# Patient Record
Sex: Male | Born: 1985 | Hispanic: No | Marital: Single
Health system: Southern US, Community
[De-identification: ages and names within clinical notes are randomized; demographics above are authoritative.]

---
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
## 2008-10-03 ENCOUNTER — Encounter: Admission: RE | Admit: 2008-10-03 | Discharge: 2008-10-03 | Payer: Self-pay | Admitting: Gastroenterology

## 2018-05-20 ENCOUNTER — Other Ambulatory Visit: Payer: Self-pay

## 2018-05-20 ENCOUNTER — Emergency Department (HOSPITAL_COMMUNITY): Payer: 59

## 2018-05-20 ENCOUNTER — Encounter (HOSPITAL_COMMUNITY): Payer: Self-pay

## 2018-05-20 ENCOUNTER — Emergency Department (HOSPITAL_COMMUNITY)
Admission: EM | Admit: 2018-05-20 | Discharge: 2018-05-20 | Disposition: A | Discharge status: Home / Self Care | Payer: 59 | Attending: Emergency Medicine | Admitting: Emergency Medicine

## 2018-05-20 DIAGNOSIS — R109 Unspecified abdominal pain: Secondary | ICD-10-CM

## 2018-05-20 DIAGNOSIS — R1012 Left upper quadrant pain: Secondary | ICD-10-CM

## 2018-05-20 DIAGNOSIS — R110 Nausea: Secondary | ICD-10-CM

## 2018-05-20 LAB — URINALYSIS, ROUTINE W REFLEX MICROSCOPIC
Bilirubin Urine: NEGATIVE
Glucose, UA: NEGATIVE mg/dL
Hgb urine dipstick: NEGATIVE
Ketones, ur: NEGATIVE mg/dL
Leukocytes, UA: NEGATIVE
Nitrite: NEGATIVE
Protein, ur: NEGATIVE mg/dL
Specific Gravity, Urine: 1.021 (ref 1.005–1.030)
pH: 7 (ref 5.0–8.0)

## 2018-05-20 LAB — BASIC METABOLIC PANEL
Anion gap: 9 (ref 5–15)
BUN: 17 mg/dL (ref 6–20)
CO2: 28 mmol/L (ref 22–32)
Calcium: 9.3 mg/dL (ref 8.9–10.3)
Chloride: 104 mmol/L (ref 101–111)
Creatinine, Ser: 1.01 mg/dL (ref 0.61–1.24)
GFR calc Af Amer: >60 mL/min (ref >60)
GFR calc non Af Amer: >60 mL/min (ref >60)
Glucose, Bld: 84 mg/dL (ref 65–99)
Potassium: 4.2 mmol/L (ref 3.5–5.1)
Sodium: 141 mmol/L (ref 135–145)

## 2018-05-20 LAB — CBC
HCT: 44.2 % (ref 39.0–52.0)
Hemoglobin: 14.9 g/dL (ref 13.0–17.0)
MCH: 27.6 pg (ref 26.0–34.0)
MCHC: 33.7 g/dL (ref 30.0–36.0)
MCV: 81.9 fL (ref 78.0–100.0)
Platelets: 345 10*3/uL (ref 150–400)
RBC: 5.4 MIL/uL (ref 4.22–5.81)
RDW: 13.6 % (ref 11.5–15.5)
WBC: 6.3 10*3/uL (ref 4.0–10.5)

## 2018-05-20 LAB — HEPATIC FUNCTION PANEL
ALBUMIN: 4.1 g/dL (ref 3.5–5.0)
ALT: 35 U/L (ref 17–63)
AST: 23 U/L (ref 15–41)
Alkaline Phosphatase: 72 U/L (ref 38–126)
Bilirubin, Direct: 0.1 mg/dL (ref 0.1–0.5)
Indirect Bilirubin: 0.6 mg/dL (ref 0.3–0.9)
TOTAL PROTEIN: 8.1 g/dL (ref 6.5–8.1)
Total Bilirubin: 0.7 mg/dL (ref 0.3–1.2)

## 2018-05-20 LAB — LIPASE, BLOOD: Lipase: 29 U/L (ref 11–51)

## 2018-05-20 MED ORDER — SUCRALFATE 1 GM/10ML PO SUSP: 1.0000 g | Freq: Three times a day (TID) | ORAL | 0 refills | Status: AC

## 2018-05-20 MED ORDER — ONDANSETRON HCL 4 MG PO TABS: 4.0000 mg | ORAL_TABLET | Freq: Three times a day (TID) | ORAL | 0 refills | Status: AC | PRN

## 2018-05-20 NOTE — ED Notes (Signed)
Patient transported to CT 

## 2018-05-20 NOTE — ED Provider Notes (Signed)
[facility]-EMERGENCY DEPT Provider Note   CSN: [id number] Arrival date & time: 05/20/18  1209     History   Chief Complaint Chief Complaint  Patient presents with  . Flank Pain    HPI Bradley Petersen is a 32 y.o. male with no significant past medical history presents emergency department today with left flank pain that radiates into his left mid abdomen last 2 to 3 days.  Patient states that he was sitting at work when he started having a sharp/knawing pain that radiated from his left flank and his left abdomen with associated nausea without emesis.  He notes he has been taking over-the-counter medication for symptoms with mild relief.  He denies history of same pain in the past.  No history of kidney stones, Gi bleed or ulcer.  Patient states that he thought this may be related to working out as he does have some back pain usually after dead lifting.  He denies any midline back pain, bowel/bladder incontinence, urinary retention, numbness/tingling/weakness of the lower extremities.  He states that his pain is worse with twisting of the abdomen.  He states that it is approved at times after eating.  Patient states that he has also had thin, dark brown stools for the last 2 days.  He denies any melena or hematochezia.  Last bowel movement this morning.  He still passing flatus.  Patient denies any fever, chills, chest pain, shortness of breath, bilious or bloody emesis, urinary frequency, urinary urgency, dysuria, hematuria, decreased urine output, foul-smelling urine.  Pain does not radiate into his groin or testicular region.  He denies any penile discharge or penile pain.  No previous abdominal surgeries. Patient does state that he has been using ibuprofen more often recently due to msk pain and a sinus infection that he has a few weeks ago.   HPI  History reviewed. No pertinent past medical history.  There are no active problems to display for this  patient.     Home Medications    Prior to Admission medications   Not on File    Family History History reviewed. No pertinent family history.  Social History Social History   Tobacco Use  . Smoking status: Not on file  Substance Use Topics  . Alcohol use: Not on file  . Drug use: Not on file     Allergies   Sulfa antibiotics   Review of Systems Review of Systems  All other systems reviewed and are negative.    Physical Exam Updated Vital Signs BP 127/88 (BP Location: Right Arm)   Pulse 72   Temp 98 F (36.7 C) (Oral)   Resp 15   SpO2 100%   Physical Exam  Constitutional: He appears well-developed and well-nourished.  Non-toxic appearing. No acute distress   HENT:  Head: Normocephalic and atraumatic.  Right Ear: External ear normal.  Left Ear: External ear normal.  Nose: Nose normal.  Mouth/Throat: Uvula is midline, oropharynx is clear and moist and mucous membranes are normal. No tonsillar exudate.  Eyes: Pupils are equal, round, and reactive to light. Right eye exhibits no discharge. Left eye exhibits no discharge. No scleral icterus.  Neck: Trachea normal. Neck supple. No spinous process tenderness present. No neck rigidity. Normal range of motion present.  Cardiovascular: Normal rate, regular rhythm and intact distal pulses.  No murmur heard. Pulses:      Radial pulses are 2+ on the right side, and 2+ on the left side.  Dorsalis pedis pulses are 2+ on the right side, and 2+ on the left side.       Posterior tibial pulses are 2+ on the right side, and 2+ on the left side.  No lower extremity swelling or edema. Calves symmetric in size bilaterally.  Pulmonary/Chest: Effort normal and breath sounds normal. He exhibits no tenderness.  Abdominal: Soft. Normal appearance and bowel sounds are normal. He exhibits no distension and no pulsatile midline mass. There is no tenderness. There is no rigidity, no rebound, no guarding and no CVA tenderness.   Musculoskeletal: He exhibits no edema.  No cervical, thoracic or lumbar spinous tenderness palpation.  No paraspinal tenderness palpation.  Bilateral lower extremity strength 5/5 and equal.  Normal sensation to the lower extremities bilaterally.  Dorsalis pedis and posterior tib pulses 2+ bilaterally.  Compartments soft of the lower extremities.  Lymphadenopathy:    He has no cervical adenopathy.  Neurological: He is alert. He has normal strength. No sensory deficit.  Moves all 4 extremities without difficulty.  Skin: Skin is warm and dry. Capillary refill takes less than 2 seconds. No rash noted. He is not diaphoretic.  No vesicular-like rash on the left flank or left abdomen  Psychiatric: He has a normal mood and affect.  Nursing note and vitals reviewed.    ED Treatments / Results  Labs (all labs ordered are listed, but only abnormal results are displayed) Labs Reviewed  URINALYSIS, ROUTINE W REFLEX MICROSCOPIC  CBC  BASIC METABOLIC PANEL  HEPATIC FUNCTION PANEL  LIPASE, BLOOD    EKG None  Radiology Ct Renal Stone Study  Result Date: 05/20/2018 CLINICAL DATA:  Acute left flank pain. EXAM: CT ABDOMEN AND PELVIS WITHOUT CONTRAST TECHNIQUE: Multidetector CT imaging of the abdomen and pelvis was performed following the standard protocol without IV contrast. COMPARISON:  CT scan of October 03, 2008. FINDINGS: Lower chest: No acute abnormality. Hepatobiliary: No focal liver abnormality is seen. No gallstones, gallbladder wall thickening, or biliary dilatation. Pancreas: Unremarkable. No pancreatic ductal dilatation or surrounding inflammatory changes. Spleen: Normal in size without focal abnormality. Adrenals/Urinary Tract: Adrenal glands are unremarkable. Kidneys are normal, without renal calculi, focal lesion, or hydronephrosis. Bladder is unremarkable. Stomach/Bowel: Stomach is within normal limits. Appendix appears normal. No evidence of bowel wall thickening, distention, or  inflammatory changes. Vascular/Lymphatic: No significant vascular findings are present. No enlarged abdominal or pelvic lymph nodes. Reproductive: Prostate is unremarkable. Other: No abdominal wall hernia or abnormality. No abdominopelvic ascites. Musculoskeletal: No acute or significant osseous findings. IMPRESSION: No hydronephrosis or renal obstruction is noted. No renal or ureteral calculi are noted. No significant abnormality seen in the abdomen or pelvis. Electronically Signed   By: Lupita Raider, M.D.   On: 05/20/2018 14:38    Procedures Procedures (including critical care time)  Medications Ordered in ED Medications - No data to display   Initial Impression / Assessment and Plan / ED Course  I have reviewed the triage vital signs and the nursing notes.  Pertinent labs & imaging results that were available during my care of the patient were reviewed by me and considered in my medical decision making (see chart for details).     32 year old male presenting for left flank pain that radiates into his left mid abdomen over the last 2-3 days associated gnawing.  Pain is worse with movement as well as after eating.  He notes nausea without emesis.  No history of abdominal surgeries.  Still passing gas and normal bowel movement  today.  No melena or hematochezia.  No urinary symptoms.  Patient vital signs are reassuring on presentation.  No CVA tenderness.  Abdominal exam is benign.  No peritoneal signs.  Patient denies IV fluid, pain medication or nausea medication at this current time.  Lab work is completely unremarkable.  No leukocytosis.  No anemia.  No significant electrolyte derangements.  Kidney function within normal limits.  No anion gap acidosis.  LFTs within normal limits.  Lipase within normal limits.  UA without evidence of UTI or hemoglobin.  CT scan without evidence of kidney stone or other acute intra-abdominal pathology.   Will treat patient for gastritis.  Will tell patient  to avoid NSAIDs and alcohol.  Will give Carafate. The evaluation does not show pathology that would require ongoing emergent intervention or inpatient treatment. I advised the patient to follow-up with PCP this week. I advised the patient to return to the emergency department with new or worsening symptoms or new concerns. Specific return precautions discussed. The patient verbalized understanding and agreement with plan. All questions answered. No further questions at this time. The patient is hemodynamically stable, mentating appropriately and appears safe for discharge.  Final Clinical Impressions(s) / ED Diagnoses   Final diagnoses:  Flank pain  Nausea  LUQ abdominal pain    ED Discharge Orders        Ordered    sucralfate (CARAFATE) 1 GM/10ML suspension  3 times daily with meals & bedtime     05/20/18 1542    ondansetron (ZOFRAN) 4 MG tablet  Every 8 hours PRN     05/20/18 1542       Princella Pellegrini 05/20/18 1543    Benjiman Core, MD 05/20/18 [id number]

## 2018-05-20 NOTE — Discharge Instructions (Addendum)
Your lab work, urinalysis and Ct scan were reassuring.  I would like you to take the medication I am prescribing.  It is possible your symptoms are related to inflammation of the stomach lining (gastritis). I would like you to avoid nsaids such as ibuprofen. Also avoid alcohol. Take tylenol as needed for pain.  Follow up with your primary care provider this week.  If you develop worsening or new concerning symptoms you can return to the emergency department for re-evaluation.

## 2018-05-20 NOTE — ED Triage Notes (Addendum)
Pt reports intermittent L sided flank pain x3 days. He also endorses intermittent nausea without vomiting. He also states that his bowel movements have been "darker and thinner." Denies hematuria or dysuria. Sent from urgent care. A&Ox4. Ambulatory.

## 2019-07-16 ENCOUNTER — Ambulatory Visit (HOSPITAL_COMMUNITY): Admission: EM | Admit: 2019-07-16 | Discharge: 2019-07-16 | Discharge status: Against Medical Advice | Payer: 59

## 2019-07-23 ENCOUNTER — Other Ambulatory Visit: Payer: Self-pay

## 2019-07-23 ENCOUNTER — Other Ambulatory Visit: Payer: Self-pay | Admitting: Internal Medicine

## 2019-07-23 ENCOUNTER — Ambulatory Visit
Admission: RE | Admit: 2019-07-23 | Discharge: 2019-07-23 | Disposition: A | Discharge status: Home / Self Care | Payer: Self-pay | Source: Ambulatory Visit | Attending: Internal Medicine | Admitting: Internal Medicine

## 2019-07-23 DIAGNOSIS — M545 Low back pain, unspecified: Secondary | ICD-10-CM

## 2020-11-14 IMAGING — CR LUMBAR SPINE - COMPLETE 4+ VIEW
5 series · 5 of 5 positions shown · non-contrast
Comparison: CT 03/20/2018

CLINICAL DATA: Low back pain

EXAM:
LUMBAR SPINE - COMPLETE 4+ VIEW

[t lumbar spine ap]
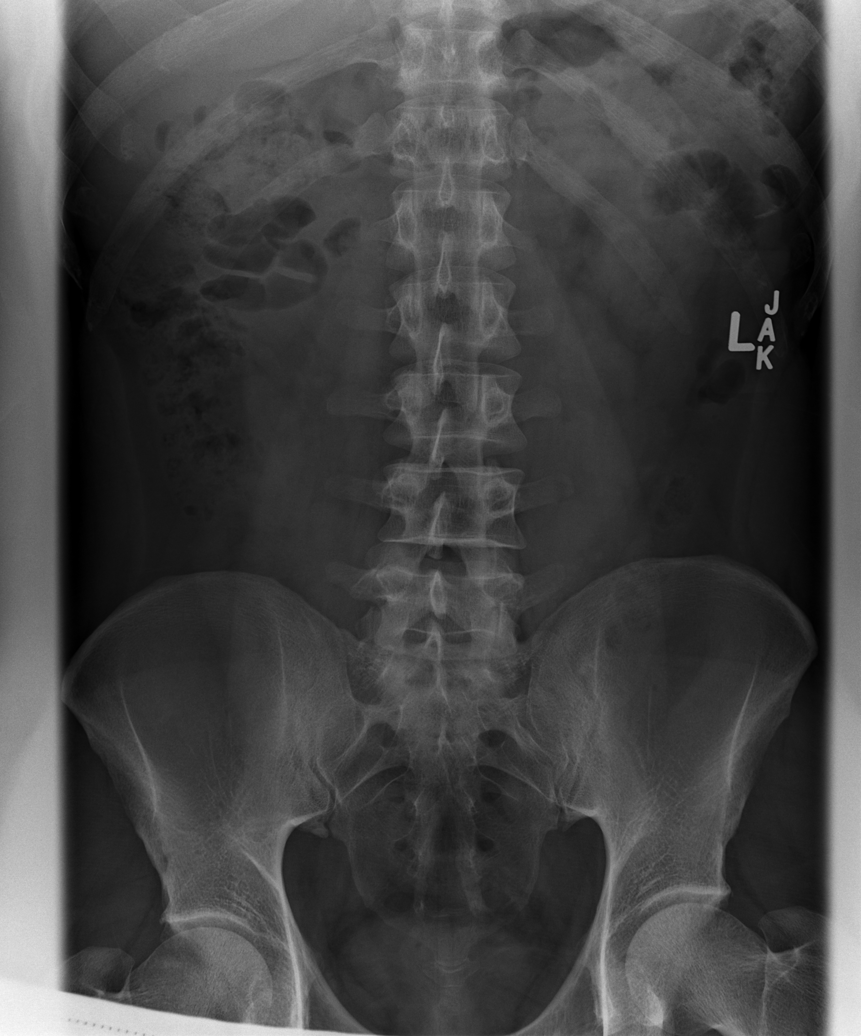

[t lumbar spine obl (1 of 2)]
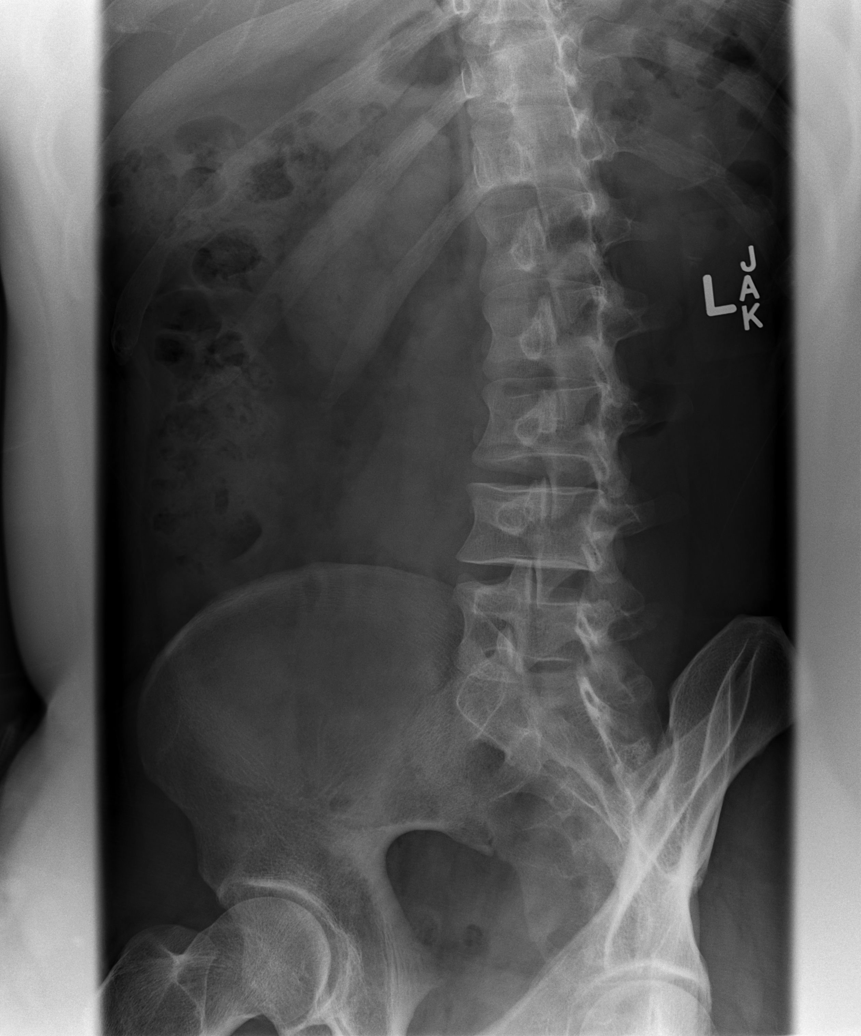

[t lumbar spine obl (2 of 2)]
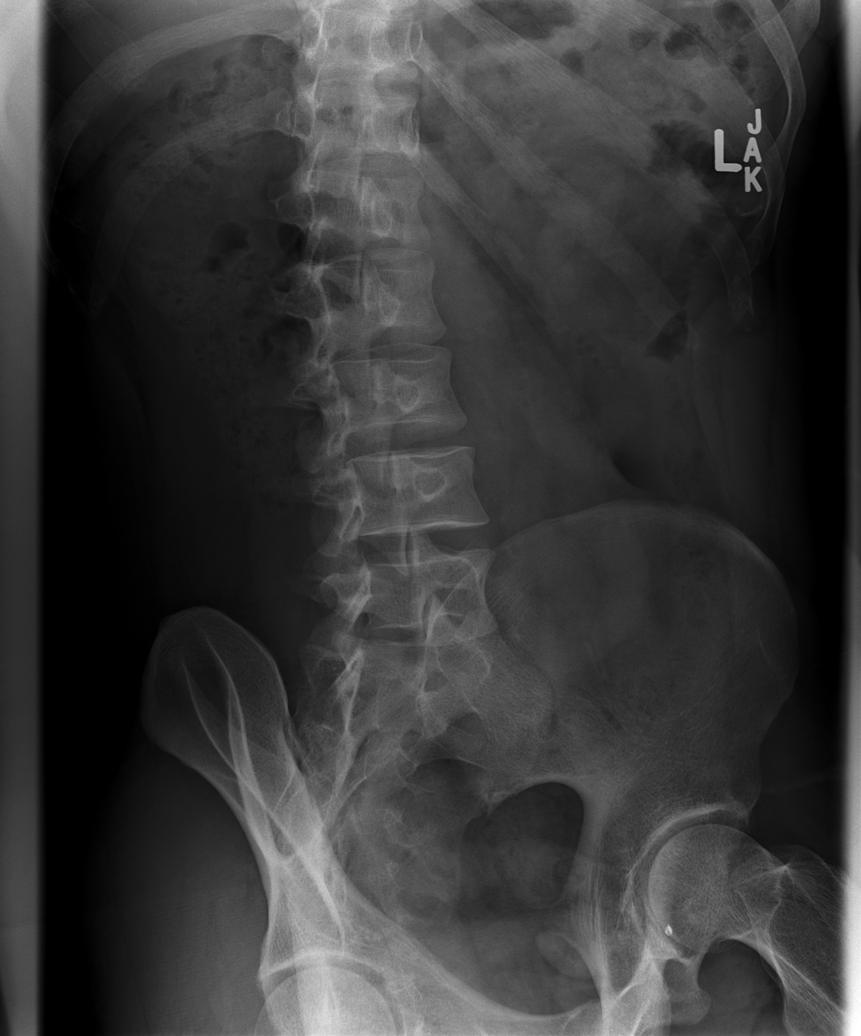

[t lumbar spine lat]
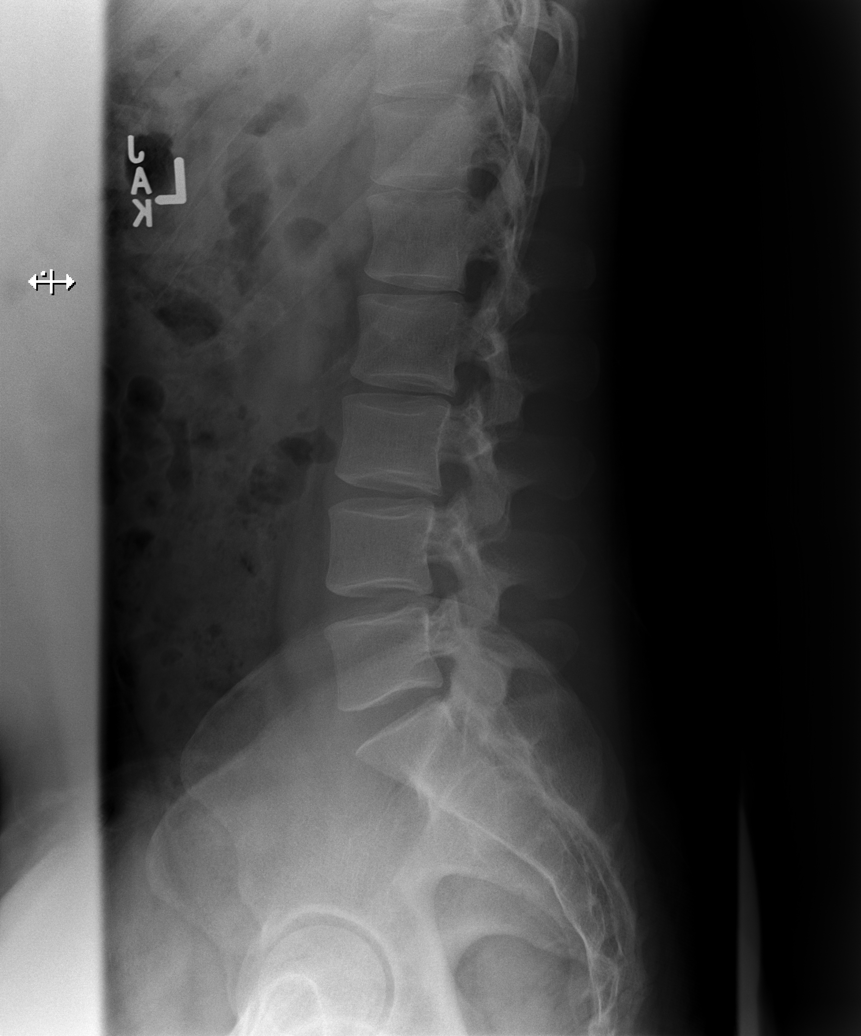

[t lumbar l-5 s-1 spot]
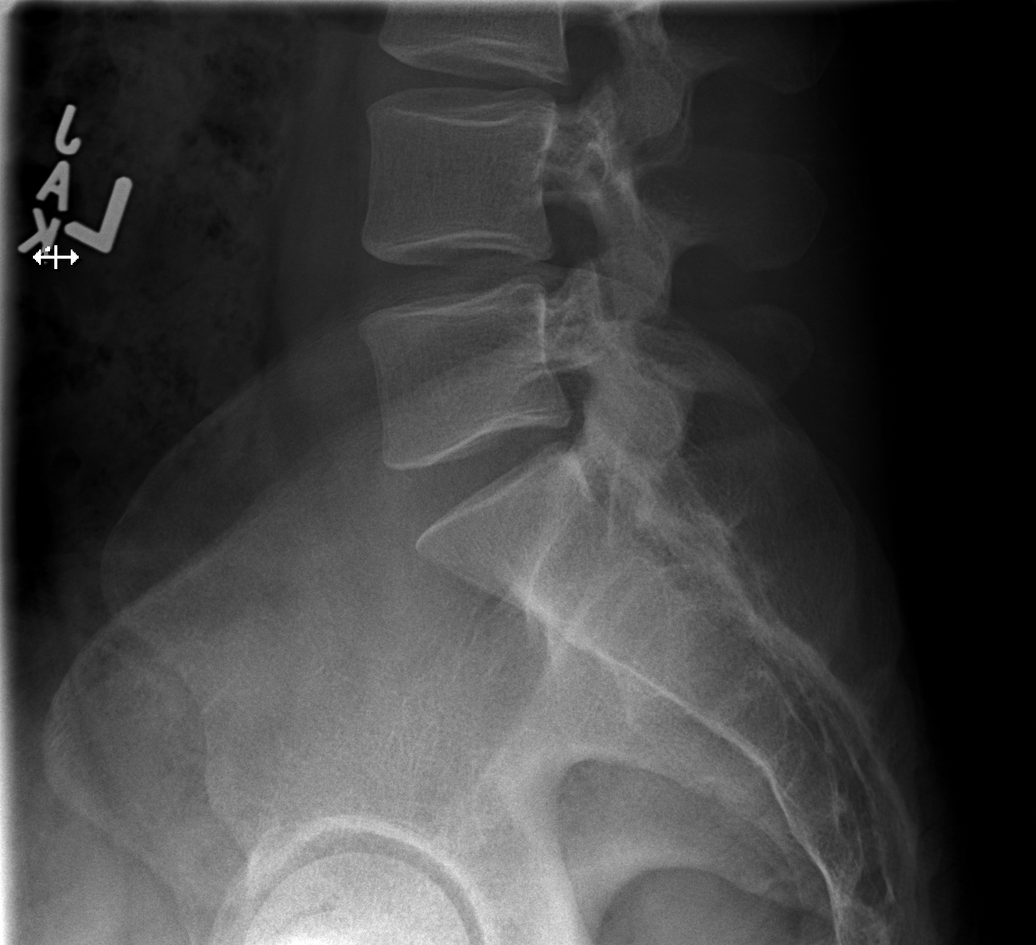

[5 of 5 positions shown; findings below may reference images not displayed]

FINDINGS: There is no evidence of lumbar spine fracture. Alignment is normal.
Intervertebral disc spaces are maintained.
IMPRESSION: Negative.

## 2023-12-11 ENCOUNTER — Ambulatory Visit: Admission: EM | Admit: 2023-12-11 | Discharge: 2023-12-11 | Discharge status: Against Medical Advice | Payer: 59
